# Patient Record
Sex: Female | Born: 1970 | Race: White | Hispanic: No | Smoking: Current every day smoker
Health system: Southern US, Community
[De-identification: ages and names within clinical notes are randomized; demographics above are authoritative.]

## PROBLEM LIST (undated history)

## (undated) DIAGNOSIS — N200 Calculus of kidney: Secondary | ICD-10-CM

## (undated) DIAGNOSIS — G4730 Sleep apnea, unspecified: Secondary | ICD-10-CM

## (undated) DIAGNOSIS — R0602 Shortness of breath: Secondary | ICD-10-CM

## (undated) DIAGNOSIS — K219 Gastro-esophageal reflux disease without esophagitis: Secondary | ICD-10-CM

## (undated) DIAGNOSIS — M549 Dorsalgia, unspecified: Secondary | ICD-10-CM

## (undated) DIAGNOSIS — C801 Malignant (primary) neoplasm, unspecified: Secondary | ICD-10-CM

## (undated) DIAGNOSIS — R61 Generalized hyperhidrosis: Secondary | ICD-10-CM

## (undated) DIAGNOSIS — I2510 Atherosclerotic heart disease of native coronary artery without angina pectoris: Secondary | ICD-10-CM

## (undated) DIAGNOSIS — Z87442 Personal history of urinary calculi: Secondary | ICD-10-CM

## (undated) DIAGNOSIS — I10 Essential (primary) hypertension: Secondary | ICD-10-CM

## (undated) HISTORY — DX: Dorsalgia, unspecified: M54.9

## (undated) HISTORY — DX: Shortness of breath: R06.02

## (undated) HISTORY — DX: Generalized hyperhidrosis: R61

## (undated) HISTORY — PX: BACK SURGERY: SHX140

## (undated) HISTORY — DX: Calculus of kidney: N20.0

---

## 2002-02-19 HISTORY — PX: BACK SURGERY: SHX140

## 2011-07-30 ENCOUNTER — Ambulatory Visit
Admission: RE | Admit: 2011-07-30 | Discharge: 2011-07-30 | Disposition: A | Discharge status: Home / Self Care | Payer: 59 | Source: Ambulatory Visit | Attending: Unknown Physician Specialty | Admitting: Unknown Physician Specialty

## 2011-07-30 ENCOUNTER — Other Ambulatory Visit: Payer: Self-pay | Admitting: Unknown Physician Specialty

## 2011-07-30 DIAGNOSIS — R609 Edema, unspecified: Secondary | ICD-10-CM

## 2011-07-30 DIAGNOSIS — R52 Pain, unspecified: Secondary | ICD-10-CM

## 2011-11-12 ENCOUNTER — Other Ambulatory Visit: Payer: Self-pay | Admitting: Family Medicine

## 2011-11-12 DIAGNOSIS — Z1231 Encounter for screening mammogram for malignant neoplasm of breast: Secondary | ICD-10-CM

## 2012-04-21 ENCOUNTER — Ambulatory Visit (HOSPITAL_COMMUNITY): Payer: 59 | Admitting: Behavioral Health

## 2012-10-13 ENCOUNTER — Ambulatory Visit (HOSPITAL_COMMUNITY): Payer: Self-pay | Admitting: Psychiatry

## 2012-10-15 ENCOUNTER — Ambulatory Visit (INDEPENDENT_AMBULATORY_CARE_PROVIDER_SITE_OTHER): Payer: 59 | Admitting: Psychiatry

## 2012-10-15 ENCOUNTER — Encounter (HOSPITAL_COMMUNITY): Payer: Self-pay | Admitting: Psychiatry

## 2012-10-15 VITALS — BP 123/75 | HR 63 | Ht 62.5 in | Wt 152.0 lb

## 2012-10-15 DIAGNOSIS — G4700 Insomnia, unspecified: Secondary | ICD-10-CM

## 2012-10-15 DIAGNOSIS — F332 Major depressive disorder, recurrent severe without psychotic features: Secondary | ICD-10-CM | POA: Insufficient documentation

## 2012-10-15 DIAGNOSIS — F4310 Post-traumatic stress disorder, unspecified: Secondary | ICD-10-CM

## 2012-10-15 MED ORDER — TRAZODONE HCL 50 MG PO TABS: ORAL_TABLET | ORAL | Status: DC

## 2012-10-15 NOTE — Patient Instructions (Signed)
Not Vapors 1-2 hours before Bedtime. Called tomorrow to report results of trazodone.
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
## 2012-10-16 DIAGNOSIS — F4310 Post-traumatic stress disorder, unspecified: Secondary | ICD-10-CM | POA: Insufficient documentation

## 2012-10-16 DIAGNOSIS — G4700 Insomnia, unspecified: Secondary | ICD-10-CM | POA: Insufficient documentation

## 2012-10-24 ENCOUNTER — Telehealth (HOSPITAL_COMMUNITY): Payer: Self-pay | Admitting: Psychiatry

## 2012-10-24 NOTE — Telephone Encounter (Signed)
Called patient. Left Message that I would call her on Tuesday 10/26/2012.

## 2012-10-29 ENCOUNTER — Ambulatory Visit (INDEPENDENT_AMBULATORY_CARE_PROVIDER_SITE_OTHER): Payer: 59 | Admitting: Psychiatry

## 2012-10-29 ENCOUNTER — Encounter (HOSPITAL_COMMUNITY): Payer: Self-pay | Admitting: Psychiatry

## 2012-10-29 VITALS — BP 105/59 | HR 64 | Ht 62.5 in | Wt 152.0 lb

## 2012-10-29 DIAGNOSIS — F4310 Post-traumatic stress disorder, unspecified: Secondary | ICD-10-CM

## 2012-10-29 DIAGNOSIS — F332 Major depressive disorder, recurrent severe without psychotic features: Secondary | ICD-10-CM

## 2012-10-29 NOTE — Telephone Encounter (Signed)
Called patient. Number is no longer valid.
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
## 2012-10-31 ENCOUNTER — Telehealth (HOSPITAL_COMMUNITY): Payer: Self-pay | Admitting: Psychiatry

## 2012-10-31 NOTE — Telephone Encounter (Signed)
Erroneous encounter. Pleas ignore.

## 2012-11-20 ENCOUNTER — Ambulatory Visit (HOSPITAL_COMMUNITY): Payer: Self-pay | Admitting: Psychiatry

## 2012-12-01 ENCOUNTER — Ambulatory Visit (HOSPITAL_COMMUNITY): Payer: Self-pay | Admitting: Psychiatry

## 2012-12-03 ENCOUNTER — Telehealth (HOSPITAL_COMMUNITY): Payer: Self-pay

## 2012-12-03 NOTE — Telephone Encounter (Signed)
PT NEEDS TO SPEAK TO YOU BEFORE APPT 12/23/12

## 2012-12-04 MED ORDER — MIRTAZAPINE 15 MG PO TBDP: 15.0000 mg | ORAL_TABLET | Freq: Every day | ORAL | Status: DC

## 2012-12-04 NOTE — Telephone Encounter (Signed)
Called patient. She has not been sleeping well despite taking trazodone.  Her mother has been sick and had died in her sleep on 11-29-2022.  She reports she stopped Lexapro about 2 weeks ago.  She report she is not sleeping well Sleeping only 1 hour.  Will try mirtazapine 15 mg. Two tablets at bedtime.

## 2012-12-04 NOTE — Addendum Note (Signed)
Addended by: Larena Sox on: 12/04/2012 05:46 PM   Modules accepted: Orders, Medications

## 2012-12-07 ENCOUNTER — Other Ambulatory Visit (HOSPITAL_COMMUNITY): Payer: Self-pay | Admitting: Psychiatry

## 2012-12-16 ENCOUNTER — Other Ambulatory Visit (HOSPITAL_COMMUNITY): Payer: Self-pay | Admitting: Psychiatry

## 2012-12-23 ENCOUNTER — Ambulatory Visit (HOSPITAL_COMMUNITY): Payer: Self-pay | Admitting: Psychiatry

## 2013-01-01 ENCOUNTER — Telehealth (HOSPITAL_COMMUNITY): Payer: Self-pay

## 2013-01-01 MED ORDER — MIRTAZAPINE 30 MG PO TABS: 30.0000 mg | ORAL_TABLET | Freq: Every day | ORAL | Status: DC

## 2013-01-01 MED ORDER — ESCITALOPRAM OXALATE 20 MG PO TABS: 20.0000 mg | ORAL_TABLET | Freq: Every day | ORAL | Status: DC

## 2013-01-01 MED ORDER — TRAZODONE HCL 100 MG PO TABS: 100.0000 mg | ORAL_TABLET | Freq: Every day | ORAL | Status: DC

## 2013-01-01 NOTE — Telephone Encounter (Addendum)
Called patient.  She has been taking Lexapro 20 mg, and remeron 30 mg.  The patient reports her mood is improved, she is sleeping only 1.5 hours at once.  She states that is not sleeping.   Will reschedule patient for followup.

## 2013-01-01 NOTE — Telephone Encounter (Signed)
PT HAS HAD 3 NO SHOWS WITH YOU AND WOULD LIKE TO SPEAK TO YOU. THE LAST ONE SHE STATES IS BECAUSE SHE HAD A DEATH IN THE FAMILY. PLEASE CALL

## 2013-01-01 NOTE — Addendum Note (Signed)
Addended by: Larena Sox on: 01/01/2013 06:04 PM   Modules accepted: Orders, Medications

## 2013-02-09 ENCOUNTER — Telehealth (HOSPITAL_COMMUNITY): Payer: Self-pay

## 2013-02-11 NOTE — Telephone Encounter (Signed)
Will need to see patient prior to change of medications.

## 2013-03-03 ENCOUNTER — Other Ambulatory Visit (HOSPITAL_COMMUNITY): Payer: Self-pay | Admitting: Psychiatry

## 2013-03-10 ENCOUNTER — Other Ambulatory Visit (HOSPITAL_COMMUNITY): Payer: Self-pay | Admitting: Psychiatry

## 2013-03-10 NOTE — Telephone Encounter (Signed)
Refill request for trazodone provided.

## 2013-04-19 ENCOUNTER — Other Ambulatory Visit (HOSPITAL_COMMUNITY): Payer: Self-pay | Admitting: Psychiatry

## 2013-04-28 ENCOUNTER — Telehealth (HOSPITAL_COMMUNITY): Payer: Self-pay | Admitting: Psychiatry

## 2013-04-28 NOTE — Telephone Encounter (Deleted)
Contacted pharmacy: Spoke with

## 2013-04-28 NOTE — Telephone Encounter (Signed)
Message received on incorrect patient. No change needed

## 2013-04-30 ENCOUNTER — Telehealth (HOSPITAL_COMMUNITY): Payer: Self-pay

## 2013-04-30 NOTE — Telephone Encounter (Signed)
Cannot restart medication since she has been off it for a few weeks atleast. Needs to be seen prior to starting antidepressant

## 2013-04-30 NOTE — Telephone Encounter (Signed)
Can you check on this?

## 2013-04-30 NOTE — Telephone Encounter (Signed)
Kathryn Burgess, We cannot do prescription. She would have to wait for Dr Demetrius CharityP. If there are safety concerns she can go to the nearest ED

## 2013-04-30 NOTE — Telephone Encounter (Signed)
04/30/2013  8:45 AM Kathryn Burgess--Patient Calls:  PT wants a script for Lexapro refilled . Pt has not been seen since Sept. 2014. Please call pt to advise.

## 2013-05-08 NOTE — Telephone Encounter (Signed)
The patient reports she is not taking her medications, and has not done so fo rthe past 2 weeks. She reports a decrease in sex drive. She states suicidal or homicidal ideations. She reports she has not had had any crying spells. She has refills of trazodone left. She has had return of social anxiety. She has been on paxil, wellbutrin, zoloft in the past.

## 2013-05-13 ENCOUNTER — Ambulatory Visit (INDEPENDENT_AMBULATORY_CARE_PROVIDER_SITE_OTHER): Payer: 59 | Admitting: Psychiatry

## 2013-05-13 ENCOUNTER — Encounter (HOSPITAL_COMMUNITY): Payer: Self-pay | Admitting: Psychiatry

## 2013-05-13 VITALS — BP 100/69 | HR 73 | Wt 167.0 lb

## 2013-05-13 DIAGNOSIS — F4310 Post-traumatic stress disorder, unspecified: Secondary | ICD-10-CM

## 2013-05-13 DIAGNOSIS — F332 Major depressive disorder, recurrent severe without psychotic features: Secondary | ICD-10-CM

## 2013-05-13 MED ORDER — TRAZODONE HCL 100 MG PO TABS: ORAL_TABLET | ORAL | Status: AC

## 2013-05-13 MED ORDER — PAROXETINE HCL 10 MG PO TABS: ORAL_TABLET | ORAL | Status: DC
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
## 2013-05-25 ENCOUNTER — Telehealth (HOSPITAL_COMMUNITY): Payer: Self-pay

## 2013-05-26 NOTE — Telephone Encounter (Signed)
Called patient. Left Message stating I would call back.

## 2013-05-27 ENCOUNTER — Telehealth (HOSPITAL_COMMUNITY): Payer: Self-pay

## 2013-05-28 MED ORDER — PAROXETINE HCL 10 MG PO TABS: 10.0000 mg | ORAL_TABLET | Freq: Two times a day (BID) | ORAL | Status: AC

## 2013-05-28 NOTE — Telephone Encounter (Signed)
Patient doing well with Paxil at 10 mg BID.

## 2013-06-12 ENCOUNTER — Ambulatory Visit (HOSPITAL_COMMUNITY): Payer: Self-pay | Admitting: Psychiatry

## 2013-06-28 IMAGING — CR DG FOOT COMPLETE 3+V*L*
3 series · 3 of 3 positions shown · non-contrast
Comparison: None.

CLINICAL DATA: Left foot pain.  No injury.

LEFT FOOT - COMPLETE 3+ VIEW

[view not recorded (1 of 3)]
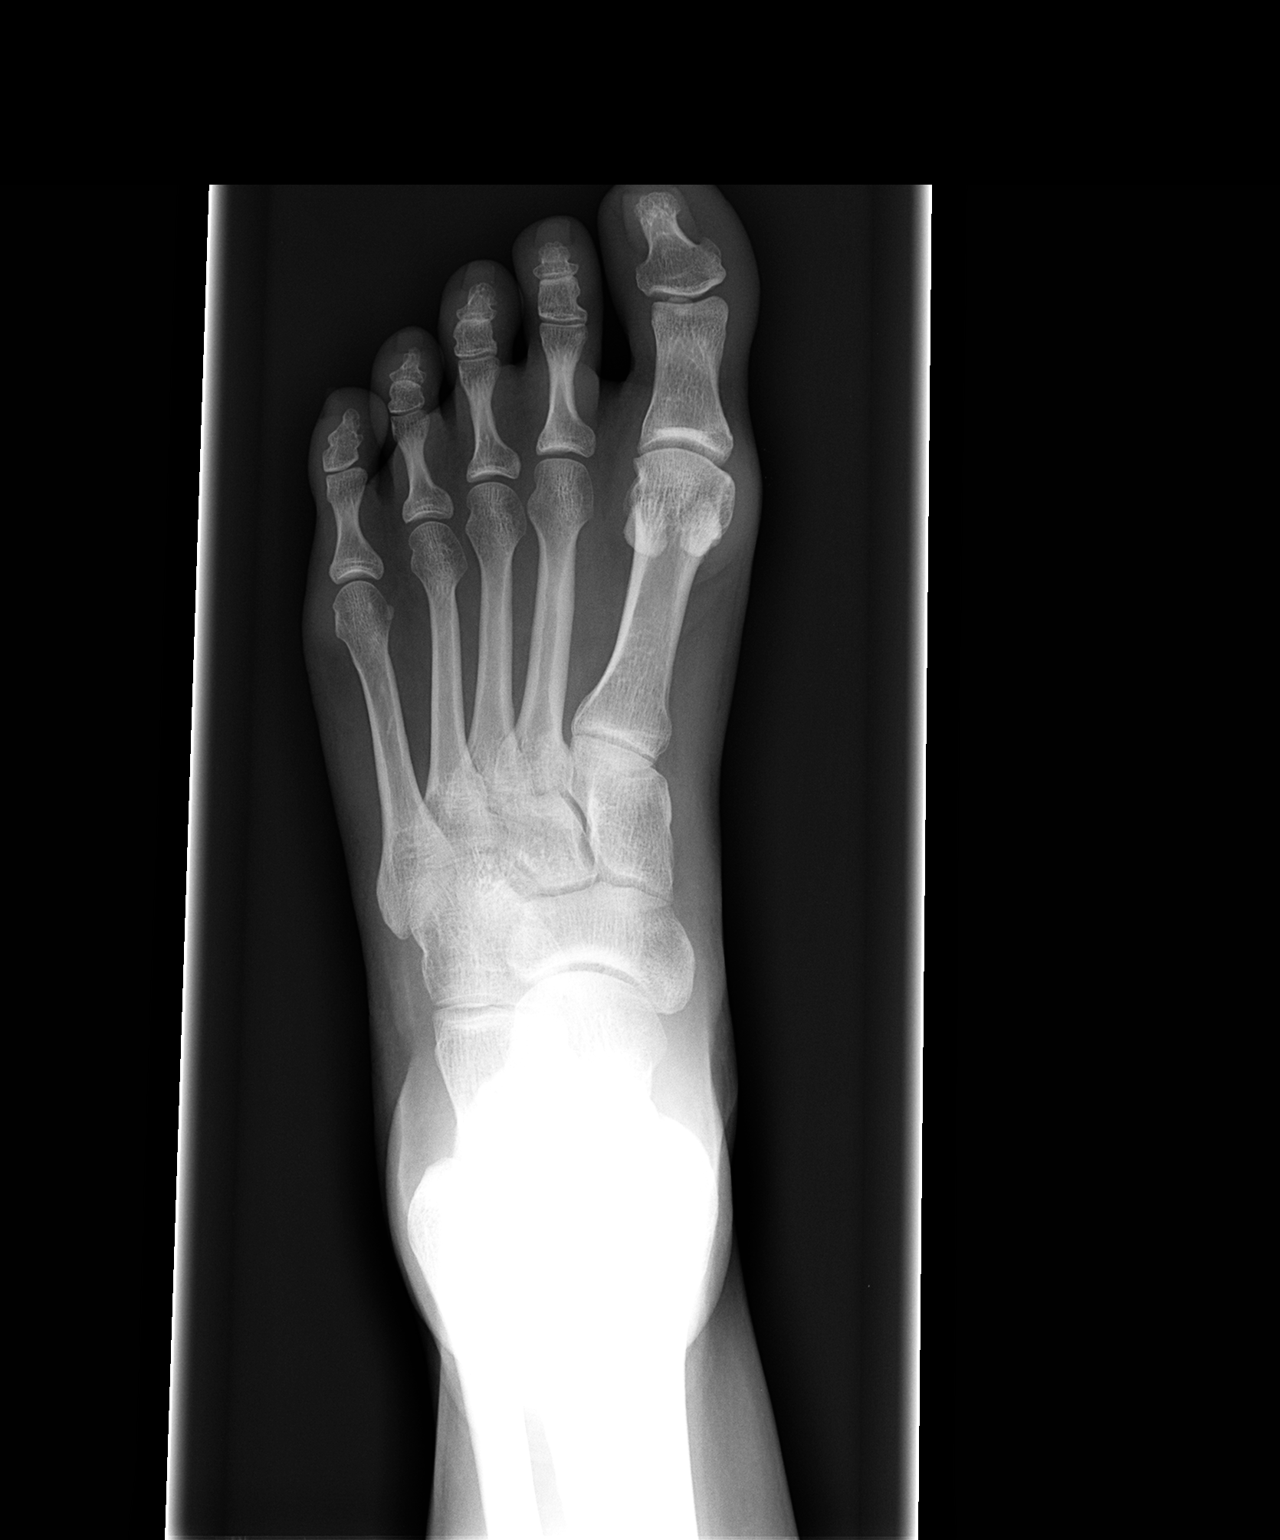

[view not recorded (2 of 3)]
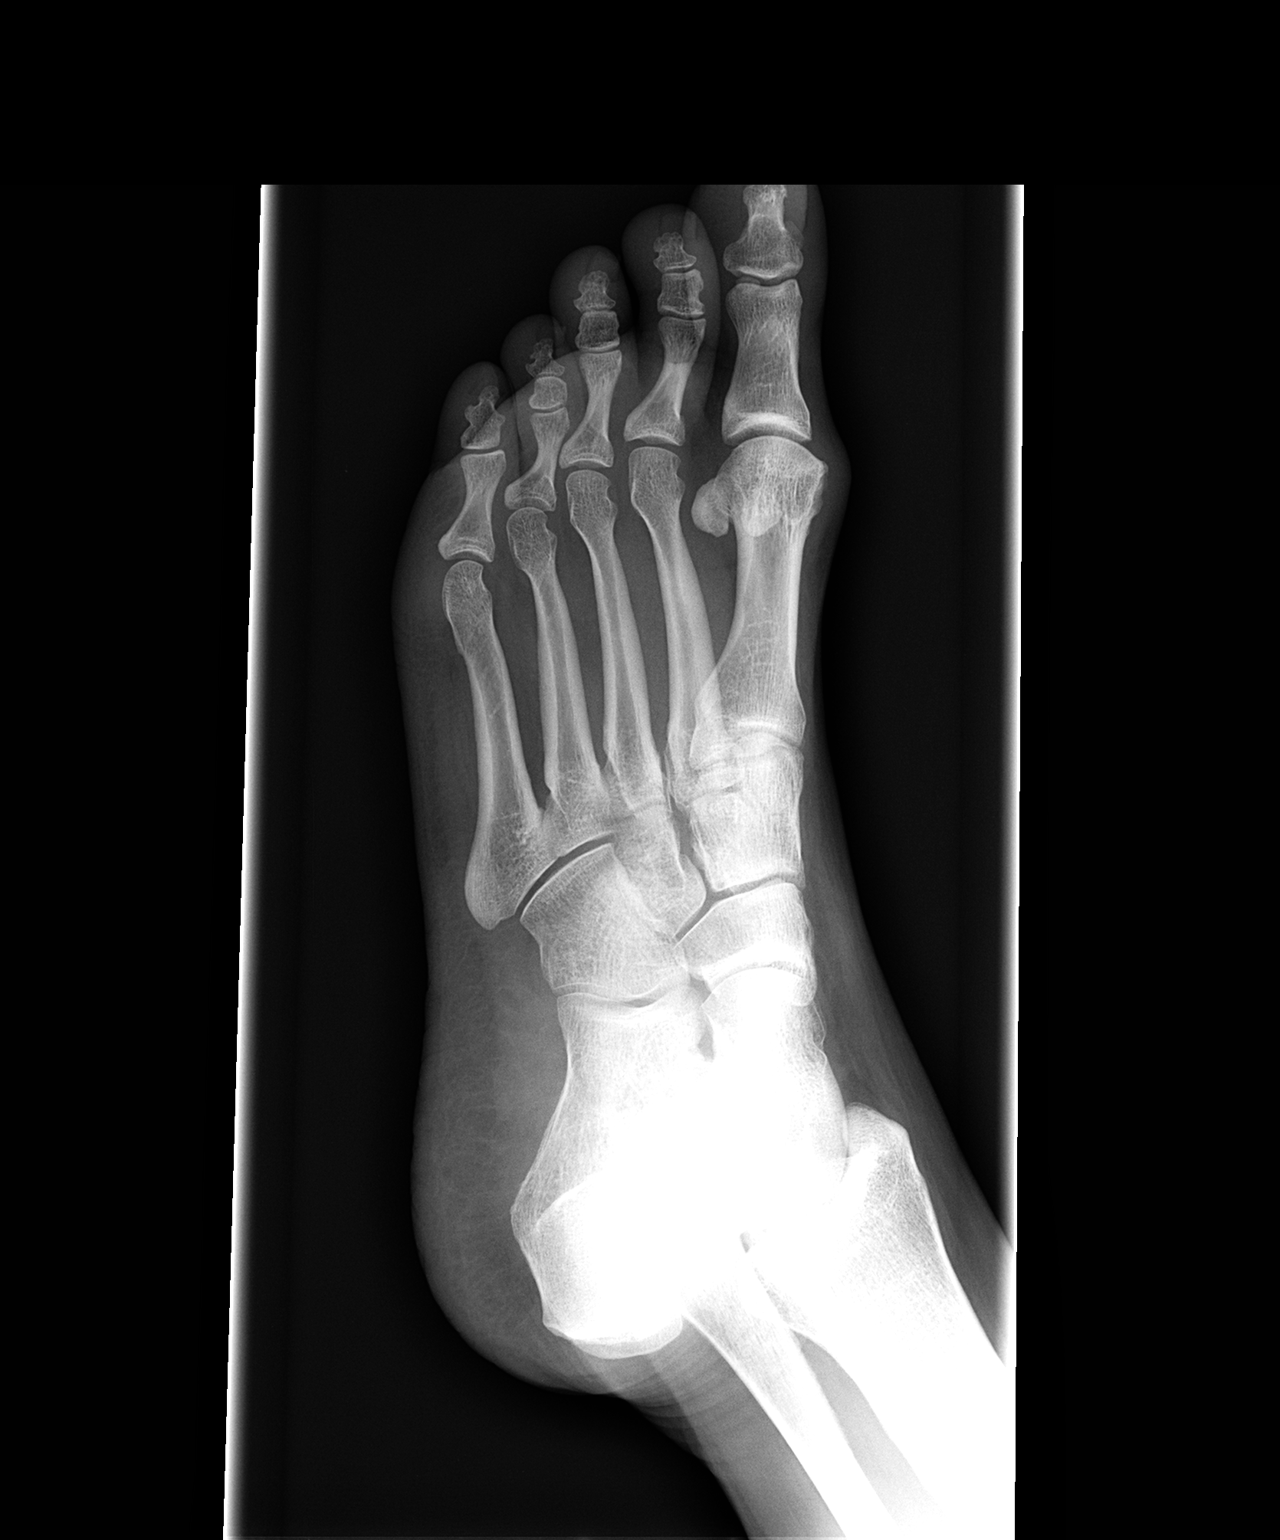

[view not recorded (3 of 3)]
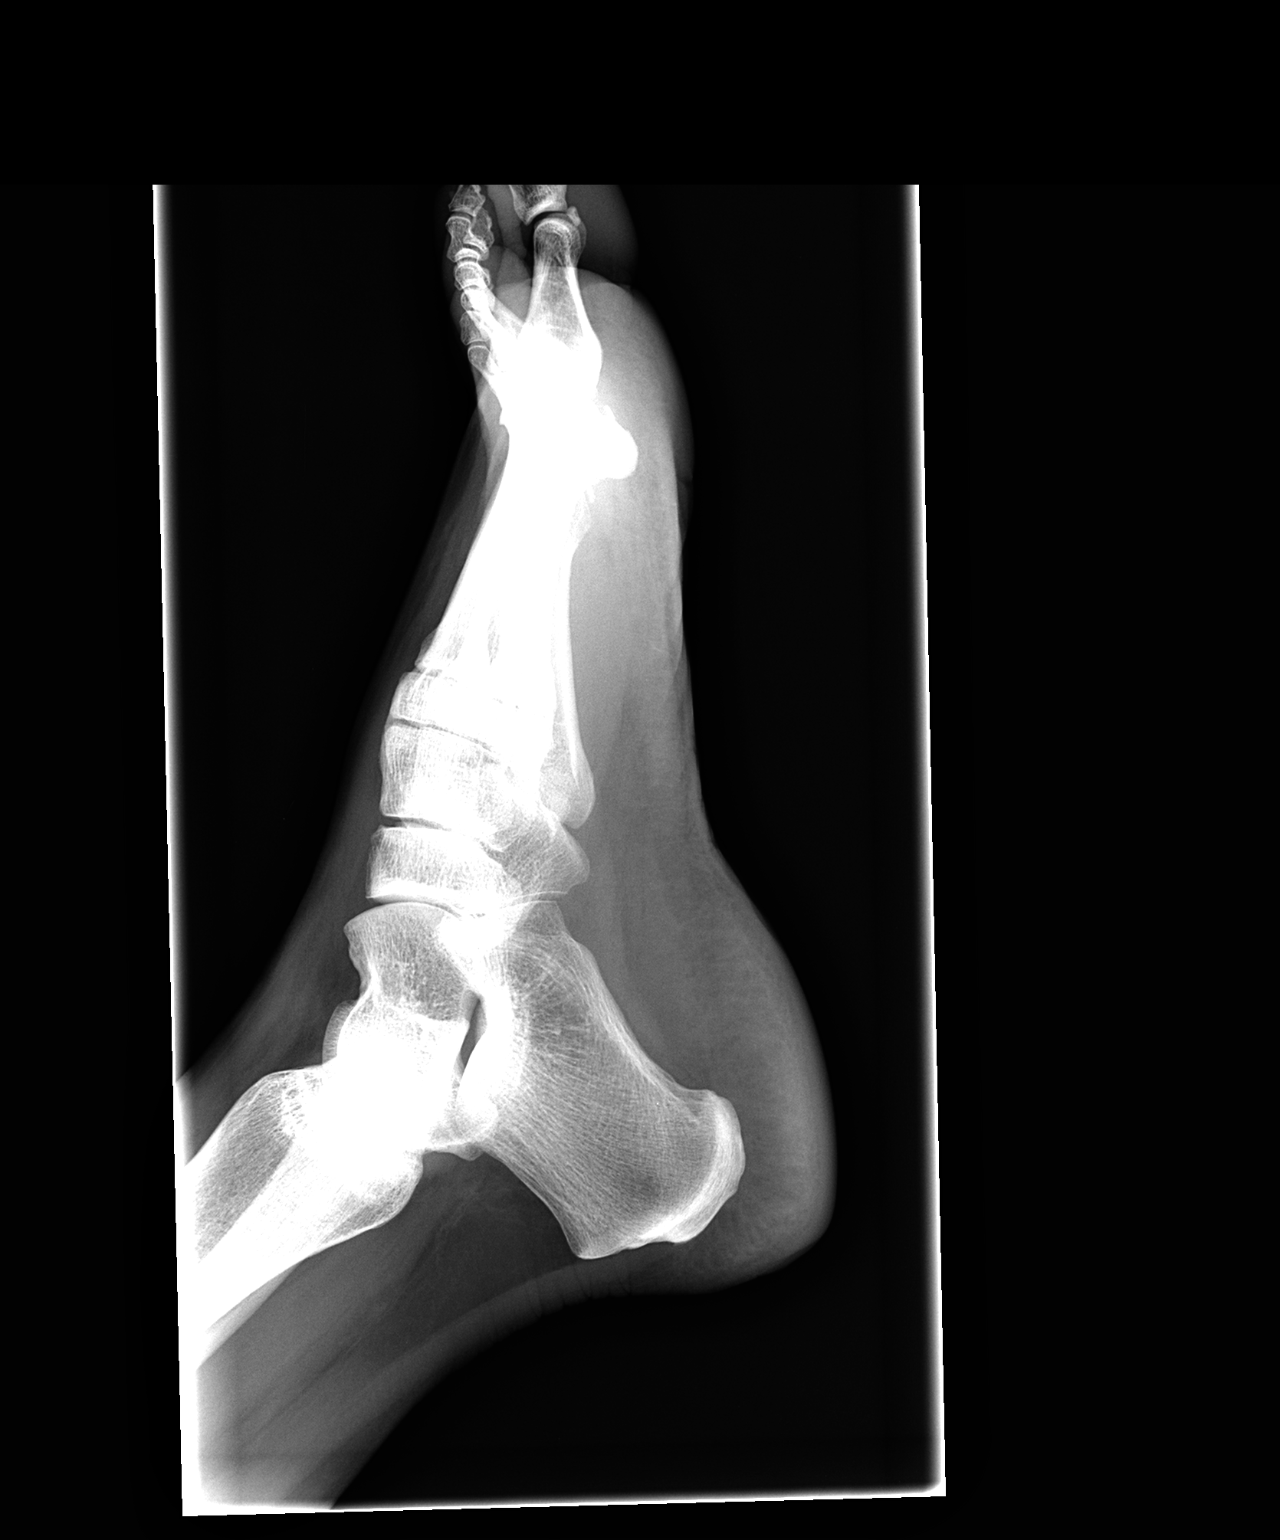

[3 of 3 positions shown; findings below may reference images not displayed]

FINDINGS: Anatomic alignment bones of the left foot.  Mild first
MTP joint osteoarthritis.  There is no fracture identified.  Soft
tissues appear within normal limits.  Fifth metatarsal base is
normal.
IMPRESSION: Mild first MTP joint osteoarthritis.

## 2018-02-19 DIAGNOSIS — I219 Acute myocardial infarction, unspecified: Secondary | ICD-10-CM

## 2018-02-19 HISTORY — DX: Acute myocardial infarction, unspecified: I21.9

## 2022-02-19 HISTORY — PX: HERNIA REPAIR: SHX51

## 2023-05-31 NOTE — Discharge Summary (Signed)
 [facility] Discharge Summary  PCP: Rocky JONELLE Burow, NP Discharge Details   Admit date:         05/29/2023 Discharge date:        06/01/2023  Hospital LOS:    1 days DIscharge Disposition:    Active Hospital Problems   Diagnosis Date Noted POA   *Chest pain 05/29/2023 Yes   Abnormal stress test 05/29/2023 Unknown   History of ST elevation myocardial infarction (STEMI) 06/16/2019 Not Applicable   Tobacco abuse 07/09/2018 Yes    Resolved Hospital Problems   Diagnosis Date Noted Date Resolved POA   Hypertensive urgency 05/29/2023 05/31/2023 Unknown   Unstable angina (*) 05/29/2023 05/31/2023 Unknown    Discharge Medication List as of 06/01/2023 10:19 AM     DISCONTINUED medications     HYDROcodone-acetaminophen (NORCO) 5-325 mg per tablet        CHANGED medications   Details  amLODIPine besylate (NORVASC) 5 mg tablet Take one tablet (5 mg dose) by mouth daily., Starting Sat 06/01/2023, Normal    hydroCHLOROthiazide 25 mg tablet Take one tablet (25 mg dose) by mouth daily., Starting Sat 06/01/2023, Normal    nitroGLYCERIN (NITROSTAT) 0.4 mg SL tablet Place one tablet (0.4 mg dose) under the tongue every 5 (five) minutes as needed for Chest pain., Starting Sat 06/01/2023, Normal       CONTINUED medications   Details  aspirin (ECOTRIN LOW DOSE) EC tablet Take one tablet (81 mg dose) by mouth daily., Starting Tue 07/31/2022, Until Wed 07/31/2023, OTC    atorvastatin (LIPITOR) 40 mg tablet TAKE ONE TABLET BY MOUTH DAILY., Starting Mon 02/18/2023, Normal    ezetimibe (ZETIA) 10 MG tablet Take one tablet (10 mg dose) by mouth daily., Starting Tue 07/31/2022, Normal    gabapentin (NEURONTIN) 300 mg capsule Take one capsule (300 mg dose) by mouth 2 (two) times daily for 30 days., Starting Fri 05/10/2023, Until Sun 06/09/2023, Normal    losartan potassium (COZAAR) 100 mg tablet TAKE ONE TABLET (100 MG DOSE) BY MOUTH DAILY., Starting Mon 01/21/2023, Normal     methocarbamol (ROBAXIN) 500 MG tablet Take one tablet (500 mg dose) by mouth 2 (two) times a day as needed for up to 30 days., Starting Fri 05/10/2023, Until Sun 06/09/2023 at 2359, Normal    pantoprazole sodium (PROTONIX) 40 mg tablet TAKE 1 TABLET BY MOUTH EVERY DAY, Normal    traZODone  (DESYREL ) 100 mg tablet TAKE 2 TABLETS BY MOUTH AT BEDTIME, Starting Fri 05/17/2023, Normal        Reason for Medication Changes:  Hospital Course  Physicians involved in care during this hospitalization Attending Provider: Balinda GORMAN Short, DO Attending Provider: Erla Simper, MD Attending Provider: Ricka JINNY Siren, MD Admitting Provider: Balinda GORMAN Short, DO Consulting Physician: Augusto ONEIDA Crawford Mickey., MD  Indication for Admission: chest pain with history of STEMI  History of Present Illness (from H&P): [name] is a 53 y.o. White or Caucasian [1] female with PMHx of hypertension and CAD (inferior MI in 2020 with PCI to RCA complicated by v fib arrest) who presented from her cardiologist's office for evaluation of chest pain and hypertension. She reports elevated BP over the past several days with readings consistently above 170/100. She noted associated chest discomfort described as twinges between her shoulder blades and lightheadedness. She also reports persistent leg swelling, which she describes as a feeling of tightness. Although not swollen today.  She has not made any changes to her antihypertensive regimen, which  includes losartan 100 mg daily. She has been compliant with this medication and has not missed any doses.    Reviewed cardiology Dr. Chalmers note from earlier today.    In ED, BP elevated at 160/74. Lab evaluation showed initial troponin T of 5 with 1 hr repeat of 5 (delta 1 hr 0) and 3 hr repeat of 6 (delta 3 hour of 1). ECG showed sinus bradycardia. CT chest/abd/pelvis showed the following:    Chest: 1. Negative for thoracic aortic aneurysm or dissection. 2. No  acute pulmonary findings. 3. Evidence of old left ventricle inferior wall myocardial infarction. 4. Nonspecific low-grade mediastinal and hilar adenopathy.   Abdomen/Pelvis: 1. Negative for abdominal aortic aneurysm or dissection. 2. No acute findings. 3. Simple left renal cyst. 4. Fecal retention and colonic diverticulosis.   Hospital Course:      Pt was admitted to med/tele floor and treated for the following:    # Chest pain with hx of STEMI/unstable angina-cPt underwent cardiolyte stress test on 05/30/2023 which showed inducible ischemia within the apical lateral wall. Infarct within the inferior wall.  Echo also showed slightly decreased EF of 45-50%. She was seen by cardiology. She underwent cardiac cath on 05/31/2023 which showed non-obstructive CAD.       # Hypertensive urgency-continue losartan. Started on HCTZ and norvasc per cardiology recommendations. She can keep BP log as outpt and record measurements 3x/day for 1 week. She can take log to next PCP appt.     # Tobacco abuse-pt counseled on cessation and treated with nicotine patch.  On day of discharge, pt was hemodynamically stable. Creatinine on day of discharge was.....   She can f/u with PCP in 1 week and with cardiology in 1-2 weeks.   Bedside Procedures   No orders found     Procedure(s) (LRB): Heart Cath-Left Diagnostic (N/A)  05/31/2023 - 06/01/2023  Surgeon(s): Norleen CHRISTELLA Solar, MD ------------------- [facility]   Discharge Procedure Orders  Follow-up with Primary Care Physician  Referral Priority: Routine Referral Type: Consultation  Referral Reason: Evaluate and Return  Number of Visits Requested: 1 Expiration Date: 11/26/23   Ambulatory referral to Cardiology  Referral Priority: Routine Referral Type: Consultation  Referral Reason: Evaluate and Return  Referred to Provider: ISABELL REDELL LABOR Requested Specialty: Cardiology  Number of Visits Requested: 1 Expiration Date: 11/27/23   Cardiac  diet (heart healthy)   Activity as tolerated   Discharge instructions  Order Comments: -continue amlodipine and hctz -f/u closely with cardiology for further titration of meds -f/u with PCP in 1 week -discontinue tobacco use   Discharge instructions  Order Comments: Please discontinue cardiac telemetry and pulse ox prior to discharge.  Also please remove IV and foley if applicable unless prior order placed to remain upon discharge.   Appointments which have been scheduled    Aug 12, 2023 3:45 PM Office Visit with Deidre Alice, NP [facility] (--) [address] [facility] [address] [place] [address] [phone]   Aug 19, 2023 8:00 AM Annual Physical with Rocky JONELLE Burow, NP [facility] (--) [address] [place] [address] [phone]        Recommendations to physicians:   Followup PCP 1 week  F/u cardiology in 1-2 weeks       Code Status:   Full Code   Time spent in discharge process:  35 minutes  This note was dictated with voice recognition software. Similar  sounding words can inadvertently be transcribed and may not be corrected upon review  Electronically signed: [place] Rita J Mullins-Hodgin, MD 06/01/2023 / 5:16 PM

## 2024-02-27 NOTE — H&P (View-Only) (Signed)
 "   REFERRING PHYSICIAN:  Laurier Lukes  PROVIDER:  LEONOR MACARIO DAWN, MD  MRN: [id number] DOB: 1970/04/03 DATE OF ENCOUNTER: 02/28/2024  Subjective   Chief Complaint: New Consultation (NEW CANCER - carcinoma on calf. exision with SLNB)     History of Present Illness: HPI: Kathryn Burgess is a 54 y.o. Female who was referred for evaluation of a newly-diagnosed melanoma. She first noticed a lesion on her right calf about a year ago, and it more recently noticed changes and scaling over the lesion. She was referred to dermatology. A shave biopsy on 01/23/24 showed a superficial spreading type melanoma with the following characteristics:  Depth: 1.3 mm Ulcerations: no Mitoses: 2 per mm2 Lymphovascular invasion: Absent Pathologic stage: T2a  She was referred to discuss surgery. She has a history of CAD and had a STEMI in 2020. She was admitted at [facility] in April 2025 with chest pain and underwent a LHC, which per report showed nonobstructive CAD. Echo showed a mildly reduced EF of 45-50%. Today she reports she still frequently has chest discomfort with exertion. She is on a baby aspirin but no other blood thinners.   Review of Systems: A complete review of systems was obtained from the patient.  I have reviewed this information and discussed as appropriate with the patient.  See HPI as well for other ROS.    Medical History: Past Medical History:  Diagnosis Date   Anxiety    CHF (congestive heart failure) (CMS/HHS-HCC)    GERD (gastroesophageal reflux disease)    History of cancer    Hyperlipidemia    Hypertension     There is no problem list on file for this patient.   Past Surgical History:  Procedure Laterality Date   CESAREAN SECTION  1997   back surgery  2004   Neck surgery  2014   .Heart cath  05/2018   HERNIA REPAIR       No Known Allergies  Current Outpatient Medications on File Prior to Visit  Medication Sig Dispense Refill   amLODIPine  (NORVASC) 5 MG tablet Take 5 mg by mouth once daily     aspirin 81 MG chewable tablet Take 81 mg by mouth once daily     atorvastatin (LIPITOR) 40 MG tablet Take 40 mg by mouth once daily     buPROPion (WELLBUTRIN XL) 150 MG XL tablet Take 150 mg by mouth once daily     cholecalciferol, vitamin D3, (VITAMIN D3) 125 mcg (5,000 unit) tablet Take 5,000 Units by mouth once daily     ezetimibe (ZETIA) 10 mg tablet Take 10 mg by mouth once daily     famotidine (PEPCID) 20 MG tablet Take 20 mg by mouth once daily     hydroCHLOROthiazide (HYDRODIURIL) 25 MG tablet Take 25 mg by mouth once daily     nitroGLYcerin (NITROSTAT) 0.4 MG SL tablet Place 0.4 mg under the tongue every 5 (five) minutes as needed     sacubitriL-valsartan (ENTRESTO) 49-51 mg tablet Take 1 tablet by mouth 2 (two) times daily     traZODone  (DESYREL ) 100 MG tablet Take 100-200 mg by mouth at bedtime     No current facility-administered medications on file prior to visit.    Family History  Problem Relation Age of Onset   Coronary Artery Disease (Blocked arteries around heart) Mother    Hyperlipidemia (Elevated cholesterol) Mother    High blood pressure (Hypertension) Mother    Stroke Mother    Coronary Artery Disease (Blocked  arteries around heart) Father    Diabetes Father    Diabetes Sister      Social History   Tobacco Use  Smoking Status Every Day   Types: Cigarettes  Smokeless Tobacco Never     Social History   Socioeconomic History   Marital status: Divorced  Tobacco Use   Smoking status: Every Day    Types: Cigarettes   Smokeless tobacco: Never  Substance and Sexual Activity   Alcohol use: Never   Drug use: Never   Social Drivers of Corporate Investment Banker Strain: Low Risk (07/10/2023)   Received from [facility]   Overall Financial Resource Strain (CARDIA)    Difficulty of Paying Living Expenses: Not hard at all  Food Insecurity: No Food Insecurity (07/10/2023)    Received from [facility]   Hunger Vital Sign    Within the past 12 months, you worried that your food would run out before you got the money to buy more.: Never true    Within the past 12 months, the food you bought just didn't last and you didn't have money to get more.: Never true  Transportation Needs: No Transportation Needs (07/10/2023)   Received from [facility] - Transportation    Lack of Transportation (Medical): No    Lack of Transportation (Non-Medical): No  Physical Activity: Unknown (05/29/2023)   Received from [facility]   Exercise Vital Sign    On average, how many days per week do you engage in moderate to strenuous exercise (like a brisk walk)?: 0 days  Stress: No Stress Concern Present (01/11/2023)   Received from [facility] - Occupational Stress Questionnaire    Feeling of Stress : Not at all  Social Connections: Moderately Integrated (05/29/2023)   Received from [facility]   Social Network    How would you rate your social network (family, work, friends)?: Adequate participation with social networks  Housing Stability: Unknown (02/28/2024)   Housing Stability Vital Sign    Homeless in the Last Year: No    Objective:    Vitals:   02/28/24 1514  BP: 105/71  Pulse: 77  Temp: 36.7 C (98 F)  TempSrc: Temporal  SpO2: 95%  Weight: 76.1 kg (167 lb 12.8 oz)  Height: 162.6 cm (5' 4)  PainSc:   4    Body mass index is 28.8 kg/m.  Physical Exam Vitals reviewed.  Constitutional:      General: She is not in acute distress.    Appearance: Normal appearance.  HENT:     Head: Normocephalic and atraumatic.  Eyes:     General: No scleral icterus.    Conjunctiva/sclera: Conjunctivae normal.  Cardiovascular:     Rate and Rhythm: Normal rate and regular rhythm.  Pulmonary:     Effort: Pulmonary effort is normal. No respiratory distress.     Breath sounds: Normal breath sounds.   Lymphadenopathy:     Comments: No palpable inguinal adenopathy bilaterally.  Skin:    Comments: Pigmented lesion on the right posterior lower leg, approximately 1cm in size. No surrounding suspicious lesions.  Neurological:     General: No focal deficit present.     Mental Status: She is alert and oriented to person, place, and time.       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  Wide local excision with sentinel lymph node biopsy was recommended. The details of the procedure were discussed with the patient, including the benefits and the risks of bleeding, infection, wound dehiscence, lymph leak, and seroma. The patient expressed understanding and consents to proceed with surgery. Will request a preoperative cardiac risk stratification from her cardiologist. Her surgery can be performed with MAC if needed to reduce the risk of adverse cardiac events. She does NOT need to hold aspirin for surgery and may continue it throughout the perioperative period. She will be scheduled for an elective surgery date. All questions were answered.  No follow-ups on file.  SHELBY LYNN ALLEN, MD       "

## 2024-02-28 ENCOUNTER — Ambulatory Visit: Payer: Self-pay | Admitting: Surgery

## 2024-02-28 DIAGNOSIS — C4371 Malignant melanoma of right lower limb, including hip: Secondary | ICD-10-CM

## 2024-03-11 NOTE — Progress Notes (Signed)
 Surgical Instructions   Your procedure is scheduled on March 19, 2024. Report to [facility] Main Entrance A at 9:30 A.M., then check in with the Admitting office. Any questions or running late day of surgery: call [phone]  Questions prior to your surgery date: call [phone], Monday-Friday, 8am-4pm. If you experience any cold or flu symptoms such as cough, fever, chills, shortness of breath, etc. between now and your scheduled surgery, please notify us  at the above number.     Remember:  Do not eat after midnight the night before your surgery   You may drink clear liquids until 8:30 the morning of your surgery.   Clear liquids allowed are: Water, Non-Citrus Juices (without pulp), Carbonated Beverages, Clear Tea (no milk, honey, etc.), Black Coffee Only (NO MILK, CREAM OR POWDERED CREAMER of any kind), and Gatorade.    Take these medicines the morning of surgery with A SIP OF WATER  amLODipine (NORVASC)  aspirin  atorvastatin (LIPITOR)  buPROPion (WELLBUTRIN XL)  ezetimibe (ZETIA)  famotidine (PEPCID)   May take these medicines IF NEEDED: nitroGLYCERIN (NITROSTAT) PLEASE CALL [phone] IF USED PRIOR TO SURGERY oxyCODONE-acetaminophen (PERCOCET/ROXICET)   One week prior to surgery, STOP taking any Aspirin (unless otherwise instructed by your surgeon) Aleve, Naproxen, Ibuprofen, Motrin, Advil, Goody's, BC's, all herbal medications, fish oil, and non-prescription vitamins.                     Do NOT Smoke (Tobacco/Vaping) for 24 hours prior to your procedure.  If you use a CPAP at night, you may bring your mask/headgear for your overnight stay.   You will be asked to remove any contacts, glasses, piercing's, hearing aid's, dentures/partials prior to surgery. Please bring cases for these items if needed.    Your surgeon will determine if you are to be admitted or discharged the same day.  Patients discharged the day of surgery will not be allowed to drive home, and  someone needs to stay with them for 24 hours.  SURGICAL WAITING ROOM VISITATION Patients may have no more than 2 support people in the waiting area - these visitors may rotate.   Pre-op nurse will coordinate an appropriate time for 2 ADULT support persons, who may not rotate, to accompany patient in pre-op.  Children under the age of 69 must have an adult with them who is not the patient and must remain in the main waiting area with an adult.  If the patient needs to stay at the hospital during part of their recovery, the visitor guidelines for inpatient rooms apply.  Please refer to the [facility] website for the visitor guidelines for any additional information.   If you received a COVID test during your pre-op visit  it is requested that you wear a mask when out in public, stay away from anyone that may not be feeling well and notify your surgeon if you develop symptoms. If you have been in contact with anyone that has tested positive in the last 10 days please notify you surgeon.      Pre-operative CHG Bathing Instructions   You can play a key role in reducing the risk of infection after surgery. Your skin needs to be as free of germs as possible. You can reduce the number of germs on your skin by washing with CHG (chlorhexidine gluconate) soap before surgery. CHG is an antiseptic soap that kills germs and continues to kill germs even after washing.   DO NOT use if you  have an allergy to chlorhexidine/CHG or antibacterial soaps. If your skin becomes reddened or irritated, stop using the CHG and notify one of our RNs at [phone].              TAKE A SHOWER THE NIGHT BEFORE SURGERY   Please keep in mind the following:  DO NOT shave, including legs and underarms, 48 hours prior to surgery.   You may shave your face before/day of surgery.  Place clean sheets on your bed the night before surgery Use a clean washcloth (not used since being washed) for shower. DO NOT sleep with pet's  night before surgery.  CHG Shower Instructions:  Wash your face and private area with normal soap. If you choose to wash your hair, wash first with your normal shampoo.  After you use shampoo/soap, rinse your hair and body thoroughly to remove shampoo/soap residue.  Turn the water OFF and apply half the bottle of CHG soap to a CLEAN washcloth.  Apply CHG soap ONLY FROM YOUR NECK DOWN TO YOUR TOES (washing for 3-5 minutes)  DO NOT use CHG soap on face, private areas, open wounds, or sores.  Pay special attention to the area where your surgery is being performed.  If you are having back surgery, having someone wash your back for you may be helpful. Wait 2 minutes after CHG soap is applied, then you may rinse off the CHG soap.  Pat dry with a clean towel  Put on clean pajamas    Additional instructions for the day of surgery: If you choose, you may shower the morning of surgery with an antibacterial soap.  DO NOT APPLY any lotions, deodorants, cologne, or perfumes.   Do not wear jewelry or makeup Do not wear nail polish, gel polish, artificial nails, or any other type of covering on natural nails (fingers and toes) Do not bring valuables to the hospital. [facility] is not responsible for valuables/personal belongings. Put on clean/comfortable clothes.  Please brush your teeth.  Ask your nurse before applying any prescription medications to the skin.

## 2024-03-12 ENCOUNTER — Encounter (HOSPITAL_COMMUNITY): Payer: Self-pay

## 2024-03-12 ENCOUNTER — Encounter (HOSPITAL_COMMUNITY)
Admission: RE | Admit: 2024-03-12 | Discharge: 2024-03-12 | Disposition: A | Payer: Self-pay | Source: Ambulatory Visit | Attending: Surgery

## 2024-03-12 ENCOUNTER — Other Ambulatory Visit: Payer: Self-pay

## 2024-03-12 VITALS — BP 118/79 | HR 71 | Temp 97.8°F | Resp 16 | Ht 64.0 in | Wt 170.5 lb

## 2024-03-12 DIAGNOSIS — R079 Chest pain, unspecified: Secondary | ICD-10-CM | POA: Diagnosis present

## 2024-03-12 DIAGNOSIS — Z01818 Encounter for other preprocedural examination: Secondary | ICD-10-CM | POA: Insufficient documentation

## 2024-03-12 DIAGNOSIS — I2510 Atherosclerotic heart disease of native coronary artery without angina pectoris: Secondary | ICD-10-CM | POA: Insufficient documentation

## 2024-03-12 DIAGNOSIS — R9439 Abnormal result of other cardiovascular function study: Secondary | ICD-10-CM | POA: Diagnosis not present

## 2024-03-12 DIAGNOSIS — I253 Aneurysm of heart: Secondary | ICD-10-CM | POA: Insufficient documentation

## 2024-03-12 DIAGNOSIS — I255 Ischemic cardiomyopathy: Secondary | ICD-10-CM | POA: Insufficient documentation

## 2024-03-12 HISTORY — DX: Malignant (primary) neoplasm, unspecified: C80.1

## 2024-03-12 HISTORY — DX: Gastro-esophageal reflux disease without esophagitis: K21.9

## 2024-03-12 HISTORY — DX: Sleep apnea, unspecified: G47.30

## 2024-03-12 HISTORY — DX: Essential (primary) hypertension: I10

## 2024-03-12 HISTORY — DX: Personal history of urinary calculi: Z87.442

## 2024-03-12 HISTORY — DX: Atherosclerotic heart disease of native coronary artery without angina pectoris: I25.10

## 2024-03-12 LAB — BASIC METABOLIC PANEL WITH GFR
Anion gap: 9 (ref 5–15)
BUN: 26 mg/dL — ABNORMAL HIGH (ref 6–20)
CO2: 29 mmol/L (ref 22–32)
Calcium: 9.4 mg/dL (ref 8.9–10.3)
Chloride: 101 mmol/L (ref 98–111)
Creatinine, Ser: 1.16 mg/dL — ABNORMAL HIGH (ref 0.44–1.00)
GFR, Estimated: 55 mL/min — ABNORMAL LOW
Glucose, Bld: 104 mg/dL — ABNORMAL HIGH (ref 70–99)
Potassium: 4.9 mmol/L (ref 3.5–5.1)
Sodium: 139 mmol/L (ref 135–145)

## 2024-03-12 LAB — CBC
HCT: 43.8 % (ref 36.0–46.0)
Hemoglobin: 14.5 g/dL (ref 12.0–15.0)
MCH: 29.1 pg (ref 26.0–34.0)
MCHC: 33.1 g/dL (ref 30.0–36.0)
MCV: 88 fL (ref 80.0–100.0)
Platelets: 269 K/uL (ref 150–400)
RBC: 4.98 MIL/uL (ref 3.87–5.11)
RDW: 13.3 % (ref 11.5–15.5)
WBC: 7.8 K/uL (ref 4.0–10.5)
nRBC: 0 % (ref 0.0–0.2)

## 2024-03-12 NOTE — Progress Notes (Addendum)
 PCP - Dr. Camellia Car Cardiologist - Dr. Redell Montenegro (LOV 11/07/23, pt states she received cardiac clearance and this was given to Dr. Verla office. 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  Last dose of GLP1 agonist-  denies  Blood Thinner Instructions: denies Aspirin Instructions: told to follow surgeon's instructions, pt stated she was told to continue aspirin until surgery  ERAS Protcol - Clear liquids until 0830  COVID TEST- NA   Anesthesia review: yes, cardiac clearance, records request f/u. Pt reported intermittent burning chest pain that she attributes to GERD. Does not worsen with activity but is worse if she misses her reflux meds. Lynwood Hope, PA-C notified during PAT appt.   Patient denies shortness of breath, fever, cough and chest pain at PAT appointment. Denies any respiratory symptoms the last two months other than a chronic smoker's cough.   All instructions explained to the patient, with a verbal understanding of the material. Patient agrees to go over the instructions while at home for a better understanding. The opportunity to ask questions was provided.

## 2024-03-12 NOTE — Pre-Procedure Instructions (Signed)
 Surgical Instructions   Your procedure is scheduled on March 19, 2024. Report to [facility] Main Entrance A at 9:30 A.M., then check in with the Admitting office. Any questions or running late day of surgery: call [phone]  Questions prior to your surgery date: call [phone], Monday-Friday, 8am-4pm. If you experience any cold or flu symptoms such as cough, fever, chills, shortness of breath, etc. between now and your scheduled surgery, please notify us  at the above number.     Remember:  Do not eat after midnight the night before your surgery   You may drink clear liquids until 8:30 the morning of your surgery.   Clear liquids allowed are: Water, Non-Citrus Juices (without pulp), Carbonated Beverages, Clear Tea (no milk, honey, etc.), Black Coffee Only (NO MILK, CREAM OR POWDERED CREAMER of any kind), and Gatorade.    Take these medicines the morning of surgery with A SIP OF WATER  amLODipine (NORVASC)  atorvastatin (LIPITOR)  buPROPion (WELLBUTRIN XL)  ezetimibe (ZETIA)  famotidine (PEPCID)   May take these medicines IF NEEDED: nitroGLYCERIN (NITROSTAT) PLEASE CALL [phone] IF USED PRIOR TO SURGERY oxyCODONE-acetaminophen (PERCOCET/ROXICET)   One week prior to surgery, STOP taking any  Aleve, Naproxen, Ibuprofen, Motrin, Advil, Goody's, BC's, all herbal medications, fish oil, and non-prescription vitamins.  Follow your surgeon's instructions on taking/stopping aspirin prior to surgery.                      Do NOT Smoke (Tobacco/Vaping) for 24 hours prior to your procedure.  If you use a CPAP at night, you may bring your mask/headgear for your overnight stay.   You will be asked to remove any contacts, glasses, piercing's, hearing aid's, dentures/partials prior to surgery. Please bring cases for these items if needed.    Your surgeon will determine if you are to be admitted or discharged the same day.  Patients discharged the day of surgery will not be allowed  to drive home, and someone needs to stay with them for 24 hours.  SURGICAL WAITING ROOM VISITATION Patients may have no more than 2 support people in the waiting area - these visitors may rotate.   Pre-op nurse will coordinate an appropriate time for 2 ADULT support persons, who may not rotate, to accompany patient in pre-op.  Children under the age of 34 must have an adult with them who is not the patient and must remain in the main waiting area with an adult.  If the patient needs to stay at the hospital during part of their recovery, the visitor guidelines for inpatient rooms apply.  Please refer to the [facility] website for the visitor guidelines for any additional information.   If you received a COVID test during your pre-op visit  it is requested that you wear a mask when out in public, stay away from anyone that may not be feeling well and notify your surgeon if you develop symptoms. If you have been in contact with anyone that has tested positive in the last 10 days please notify you surgeon.      Pre-operative CHG Bathing Instructions   You can play a key role in reducing the risk of infection after surgery. Your skin needs to be as free of germs as possible. You can reduce the number of germs on your skin by washing with CHG (chlorhexidine gluconate) soap before surgery. CHG is an antiseptic soap that kills germs and continues to kill germs even after washing.   DO  NOT use if you have an allergy to chlorhexidine/CHG or antibacterial soaps. If your skin becomes reddened or irritated, stop using the CHG and notify one of our RNs at [phone].              TAKE A SHOWER THE NIGHT BEFORE SURGERY   Please keep in mind the following:  DO NOT shave, including legs and underarms, 48 hours prior to surgery.   You may shave your face before/day of surgery.  Place clean sheets on your bed the night before surgery Use a clean washcloth (not used since being washed) for shower. DO  NOT sleep with pet's night before surgery.  CHG Shower Instructions:  Wash your face and private area with normal soap. If you choose to wash your hair, wash first with your normal shampoo.  After you use shampoo/soap, rinse your hair and body thoroughly to remove shampoo/soap residue.  Turn the water OFF and apply half the bottle of CHG soap to a CLEAN washcloth.  Apply CHG soap ONLY FROM YOUR NECK DOWN TO YOUR TOES (washing for 3-5 minutes)  DO NOT use CHG soap on face, private areas, open wounds, or sores.  Pay special attention to the area where your surgery is being performed.  If you are having back surgery, having someone wash your back for you may be helpful. Wait 2 minutes after CHG soap is applied, then you may rinse off the CHG soap.  Pat dry with a clean towel  Put on clean pajamas    Additional instructions for the day of surgery: If you choose, you may shower the morning of surgery with an antibacterial soap.  DO NOT APPLY any lotions, deodorants, cologne, or perfumes.   Do not wear jewelry or makeup Do not wear nail polish, gel polish, artificial nails, or any other type of covering on natural nails (fingers and toes) Do not bring valuables to the hospital. [facility] is not responsible for valuables/personal belongings. Put on clean/comfortable clothes.  Please brush your teeth.  Ask your nurse before applying any prescription medications to the skin.

## 2024-03-13 NOTE — Anesthesia Preprocedure Evaluation (Addendum)
 "                                  Anesthesia Evaluation  Patient identified by MRN, date of birth, ID band Patient awake    Reviewed: Allergy & Precautions, NPO status , Patient's Chart, lab work & pertinent test results  History of Anesthesia Complications Negative for: history of anesthetic complications  Airway Mallampati: III  TM Distance: >3 FB Neck ROM: Full    Dental  (+) Dental Advisory Given   Pulmonary neg shortness of breath, sleep apnea , neg COPD, neg recent URI, Current Smoker (smokes 1 ppd, smoke 1 cigarette this morning)Patient did not abstain from smoking.   Pulmonary exam normal breath sounds clear to auscultation       Cardiovascular hypertension, + angina (LHC 05/2023 with non-obstructive CAD)  + CAD (non-obstructive) and + Past MI (2020)  (-) Cardiac Stents and (-) CABG (-) dysrhythmias  Rhythm:Regular Rate:Normal  Cath 05/31/2023 (Care Everywhere): CONCLUSIONS:  Chest pain syndrome with normal serial high-sensitivity troponin,  nonischemic EKG  Abnormal nuclear stress test with inferior infarct with interpretation of  apical lateral ischemia.  Nonobstructive coronary artery disease with no focal angiographic stenosis  or obstructive stenosis.  Recanalized posterior descending artery.  Ischemic cardiomyopathy with mild to moderately reduced left-ventricular  ejection fraction with inferoapical ventricular aneurysm   RECOMMENDATIONS:  Guideline directed therapies and risk factor modification    TTE 05/30/2023 (Care Everywhere): Left Ventricle: Systolic function is mildly abnormal. EF: 45-50%.  Quantitative analysis of left ventricular Global Longitudinal Strain (GLS)  imaging is -11.500%, which is abnormal. The strain pattern is nonspecific.    Left Ventricle: Doppler parameters consistent with mild diastolic  dysfunction and low to normal LA pressure.     Neuro/Psych neg Seizures PSYCHIATRIC DISORDERS (PTSD) Anxiety Depression        GI/Hepatic Neg liver ROS,GERD  ,,  Endo/Other  negative endocrine ROS    Renal/GU negative Renal ROS     Musculoskeletal   Abdominal   Peds  Hematology negative hematology ROS (+) Lab Results      Component                Value               Date                      WBC                      7.8                 03/12/2024                HGB                      14.5                03/12/2024                HCT                      43.8                03/12/2024                MCV  88.0                03/12/2024                PLT                      269                 03/12/2024              Anesthesia Other Findings Melanoma   Reproductive/Obstetrics                              Anesthesia Physical Anesthesia Plan  ASA: 3  Anesthesia Plan: General   Post-op Pain Management: Tylenol  PO (pre-op)*   Induction: Intravenous  PONV Risk Score and Plan: 2 and Ondansetron , Dexamethasone , Midazolam  and Treatment may vary due to age or medical condition  Airway Management Planned: Oral ETT  Additional Equipment:   Intra-op Plan:   Post-operative Plan: Extubation in OR  Informed Consent: I have reviewed the patients History and Physical, chart, labs and discussed the procedure including the risks, benefits and alternatives for the proposed anesthesia with the patient or authorized representative who has indicated his/her understanding and acceptance.     Dental advisory given  Plan Discussed with: CRNA and Anesthesiologist  Anesthesia Plan Comments: (Risks of general anesthesia discussed including, but not limited to, sore throat, hoarse voice, chipped/damaged teeth, injury to vocal cords, nausea and vomiting, allergic reactions, lung infection, heart attack, stroke, and death. All questions answered.   PAT note by Lynwood Hope, PA-C: 54 year old female follows with cardiology at [facility] for history of HTN, ICM, HFmrEF, CAD  s/p inferior MI in 2020 with PCI to RCA c/b V-fib arrest (distal PDA clot, no stenting, received intracoronary Aggrastat; went into V-fib after Aggrastat administration and required 1 defibrillation and converted to sinus rhythm).  Echo 05/2023 with LVEF 45 to 50%, no significant valvular abnormalities.  Abnormal stress test 05/2023 was followed by catheterization showing nonobstructive CAD with no focal angiographic stenosis or obstructive stenosis, ischemic cardiomyopathy with mild to moderately reduced left ventricular ejection fraction with inferoapical ventricular aneurysm.  Patient last seen in follow-up by Dr. Isabell on 11/07/2023, stable from cardiac standpoint.  Clearance dated 03/02/2024 states, Cardiovascular risk is low for stated procedure.  Copy scanned in media.  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  Preop labs reviewed, creatinine mildly elevated 1.16, otherwise unremarkable.  EKG 05/29/2023 (copy scanned in media): Sinus bradycardia.  Rate 55.  Possible inferior infarct (cited on or before 06/15/2019).  Anterior infarct (cited on or before 05/06/2015).  No significant change.  Cath 05/31/2023 (Care Everywhere): CONCLUSIONS:  Chest pain syndrome with normal serial high-sensitivity troponin,  nonischemic EKG  Abnormal nuclear stress test with inferior infarct with interpretation of  apical lateral ischemia.  Nonobstructive coronary artery disease with no focal angiographic stenosis  or obstructive stenosis. Recanalized posterior descending artery.  Ischemic cardiomyopathy with mild to moderately reduced left-ventricular  ejection fraction with inferoapical ventricular aneurysm   RECOMMENDATIONS:  Guideline directed therapies and risk factor modification   TTE 05/30/2023 (Care Everywhere): LeftVentricle: Systolic function is mildly abnormal. EF:  45-50%.  Quantitative analysis of left ventricular Global Longitudinal Strain (GLS)  imaging is -11.500%, which is abnormal. The strain pattern is nonspecific.  LeftVentricle: Doppler parameters consistent with mild diastolic  dysfunction and low to normal LA pressure.    )         Anesthesia Quick Evaluation  "

## 2024-03-13 NOTE — Progress Notes (Signed)
 Anesthesia Chart Review:  54 year old female follows with cardiology at [facility] for history of HTN, ICM, HFmrEF, CAD s/p inferior MI in 2020 with PCI to RCA c/b V-fib arrest (distal PDA clot, no stenting, received intracoronary Aggrastat; went into V-fib after Aggrastat administration and required 1 defibrillation and converted to sinus rhythm).  Echo 05/2023 with LVEF 45 to 50%, no significant valvular abnormalities.  Abnormal stress test 05/2023 was followed by catheterization showing nonobstructive CAD with no focal angiographic stenosis or obstructive stenosis, ischemic cardiomyopathy with mild to moderately reduced left ventricular ejection fraction with inferoapical ventricular aneurysm.  Patient last seen in follow-up by Dr. Isabell on 11/07/2023, stable from cardiac standpoint.  Clearance dated 03/02/2024 states, Cardiovascular risk is low for stated procedure.  Copy scanned in media.  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  Preop labs reviewed, creatinine mildly elevated 1.16, otherwise unremarkable.  EKG 05/29/2023 (copy scanned in media): Sinus bradycardia.  Rate 55.  Possible inferior infarct (cited on or before 06/15/2019).  Anterior infarct (cited on or before 05/06/2015).  No significant change.  Cath 05/31/2023 (Care Everywhere): CONCLUSIONS:  Chest pain syndrome with normal serial high-sensitivity troponin,  nonischemic EKG  Abnormal nuclear stress test with inferior infarct with interpretation of  apical lateral ischemia.  Nonobstructive coronary artery disease with no focal angiographic stenosis  or obstructive stenosis.  Recanalized posterior descending artery.  Ischemic cardiomyopathy with mild to moderately reduced left-ventricular  ejection fraction with inferoapical ventricular aneurysm   RECOMMENDATIONS:  Guideline directed therapies and risk  factor modification   TTE 05/30/2023 (Care Everywhere): Left Ventricle: Systolic function is mildly abnormal. EF: 45-50%.  Quantitative analysis of left ventricular Global Longitudinal Strain (GLS)  imaging is -11.500%, which is abnormal. The strain pattern is nonspecific.    Left Ventricle: Doppler parameters consistent with mild diastolic  dysfunction and low to normal LA pressure.     Lynwood Geofm RIGGERS [facility]/Anesthesiology Phone [phone] 03/13/2024 2:43 PM

## 2024-03-19 ENCOUNTER — Encounter (HOSPITAL_COMMUNITY): Payer: Self-pay | Admitting: Physician Assistant

## 2024-03-19 ENCOUNTER — Other Ambulatory Visit: Payer: Self-pay

## 2024-03-19 ENCOUNTER — Encounter (HOSPITAL_COMMUNITY): Payer: Self-pay | Admitting: Surgery

## 2024-03-19 ENCOUNTER — Ambulatory Visit (HOSPITAL_COMMUNITY)
Admission: RE | Admit: 2024-03-19 | Discharge: 2024-03-20 | Disposition: A | Discharge status: Home / Self Care | Payer: Self-pay | Attending: Surgery | Admitting: Surgery

## 2024-03-19 ENCOUNTER — Ambulatory Visit (HOSPITAL_COMMUNITY)
Admission: RE | Admit: 2024-03-19 | Discharge: 2024-03-19 | Disposition: A | Discharge status: Home / Self Care | Source: Ambulatory Visit | Attending: Surgery | Admitting: Surgery

## 2024-03-19 ENCOUNTER — Encounter (HOSPITAL_COMMUNITY)
Admission: RE | Disposition: A | Discharge status: Home / Self Care | Payer: Self-pay | Source: Home / Self Care | Attending: Surgery

## 2024-03-19 ENCOUNTER — Encounter (HOSPITAL_COMMUNITY): Payer: Self-pay | Admitting: Anesthesiology

## 2024-03-19 DIAGNOSIS — I2510 Atherosclerotic heart disease of native coronary artery without angina pectoris: Secondary | ICD-10-CM | POA: Diagnosis not present

## 2024-03-19 DIAGNOSIS — I10 Essential (primary) hypertension: Secondary | ICD-10-CM

## 2024-03-19 DIAGNOSIS — C4371 Malignant melanoma of right lower limb, including hip: Secondary | ICD-10-CM | POA: Diagnosis not present

## 2024-03-19 DIAGNOSIS — C439 Malignant melanoma of skin, unspecified: Secondary | ICD-10-CM | POA: Diagnosis present

## 2024-03-19 DIAGNOSIS — F17210 Nicotine dependence, cigarettes, uncomplicated: Secondary | ICD-10-CM

## 2024-03-19 MED ORDER — HYDROMORPHONE HCL 1 MG/ML IJ SOLN
0.5000 mg | INTRAMUSCULAR | Status: DC | PRN
  Administered 2024-03-20: 0.5 mg via INTRAVENOUS
  Filled 2024-03-19: qty 0.5

## 2024-03-19 MED ORDER — ONDANSETRON 4 MG PO TBDP: 4.0000 mg | ORAL_TABLET | Freq: Four times a day (QID) | ORAL | Status: DC | PRN

## 2024-03-19 MED ORDER — LACTATED RINGERS IV SOLN: INTRAVENOUS | Status: DC

## 2024-03-19 MED ORDER — ISOSULFAN BLUE 1 % ~~LOC~~ SOLN
5.0000 mg | Freq: Once | SUBCUTANEOUS | Status: DC
  Administered 2024-03-19: 5 mg via SUBCUTANEOUS
  Filled 2024-03-19: qty 5

## 2024-03-19 MED ORDER — 0.9 % SODIUM CHLORIDE (POUR BTL) OPTIME
TOPICAL | Status: DC | PRN
  Administered 2024-03-19: 1000 mL

## 2024-03-19 MED ORDER — PHENYLEPHRINE 80 MCG/ML (10ML) SYRINGE FOR IV PUSH (FOR BLOOD PRESSURE SUPPORT)
PREFILLED_SYRINGE | INTRAVENOUS | Status: AC
  Filled 2024-03-19: qty 10

## 2024-03-19 MED ORDER — ENOXAPARIN SODIUM 40 MG/0.4ML IJ SOSY: 40.0000 mg | PREFILLED_SYRINGE | INTRAMUSCULAR | Status: DC

## 2024-03-19 MED ORDER — MIDAZOLAM HCL (PF) 2 MG/2ML IJ SOLN
INTRAMUSCULAR | Status: DC | PRN
  Administered 2024-03-19: 2 mg via INTRAVENOUS

## 2024-03-19 MED ORDER — PHENYLEPHRINE HCL-NACL 20-0.9 MG/250ML-% IV SOLN
INTRAVENOUS | Status: DC | PRN
  Administered 2024-03-19: 80 ug via INTRAVENOUS

## 2024-03-19 MED ORDER — ONDANSETRON HCL 4 MG/2ML IJ SOLN
INTRAMUSCULAR | Status: DC | PRN
  Administered 2024-03-19: 4 mg via INTRAVENOUS

## 2024-03-19 MED ORDER — MIDAZOLAM HCL 2 MG/2ML IJ SOLN
INTRAMUSCULAR | Status: AC
  Filled 2024-03-19: qty 2

## 2024-03-19 MED ORDER — DIPHENHYDRAMINE HCL 50 MG/ML IJ SOLN: 25.0000 mg | Freq: Four times a day (QID) | INTRAMUSCULAR | Status: DC | PRN

## 2024-03-19 MED ORDER — CHLORHEXIDINE GLUCONATE 0.12 % MT SOLN
15.0000 mL | Freq: Once | OROMUCOSAL | Status: AC
  Administered 2024-03-19: 15 mL via OROMUCOSAL
  Filled 2024-03-19: qty 15

## 2024-03-19 MED ORDER — ONDANSETRON HCL 4 MG/2ML IJ SOLN
INTRAMUSCULAR | Status: AC
  Filled 2024-03-19: qty 2

## 2024-03-19 MED ORDER — ROCURONIUM BROMIDE 10 MG/ML (PF) SYRINGE
PREFILLED_SYRINGE | INTRAVENOUS | Status: AC
  Filled 2024-03-19: qty 10

## 2024-03-19 MED ORDER — PROPOFOL 10 MG/ML IV BOLUS
INTRAVENOUS | Status: DC | PRN
  Administered 2024-03-19: 200 mg via INTRAVENOUS

## 2024-03-19 MED ORDER — FENTANYL CITRATE (PF) 100 MCG/2ML IJ SOLN
INTRAMUSCULAR | Status: AC
  Filled 2024-03-19: qty 2

## 2024-03-19 MED ORDER — EPHEDRINE SULFATE-NACL 50-0.9 MG/10ML-% IV SOSY
PREFILLED_SYRINGE | INTRAVENOUS | Status: DC | PRN
  Administered 2024-03-19 (×2): 5 mg via INTRAVENOUS

## 2024-03-19 MED ORDER — FENTANYL CITRATE (PF) 100 MCG/2ML IJ SOLN
25.0000 ug | INTRAMUSCULAR | Status: DC | PRN
  Administered 2024-03-19 (×2): 50 ug via INTRAVENOUS

## 2024-03-19 MED ORDER — DIPHENHYDRAMINE HCL 25 MG PO CAPS
25.0000 mg | ORAL_CAPSULE | Freq: Four times a day (QID) | ORAL | Status: DC | PRN
  Administered 2024-03-19: 25 mg via ORAL
  Filled 2024-03-19: qty 1

## 2024-03-19 MED ORDER — MELATONIN 3 MG PO TABS
3.0000 mg | ORAL_TABLET | Freq: Every evening | ORAL | Status: DC | PRN
  Administered 2024-03-20: 3 mg via ORAL
  Filled 2024-03-19: qty 1

## 2024-03-19 MED ORDER — DEXAMETHASONE SOD PHOSPHATE PF 10 MG/ML IJ SOLN
INTRAMUSCULAR | Status: DC | PRN
  Administered 2024-03-19: 10 mg via INTRAVENOUS

## 2024-03-19 MED ORDER — LIDOCAINE 2% (20 MG/ML) 5 ML SYRINGE
INTRAMUSCULAR | Status: DC | PRN
  Administered 2024-03-19: 100 mg via INTRAVENOUS

## 2024-03-19 MED ORDER — ACETAMINOPHEN 500 MG PO TABS
1000.0000 mg | ORAL_TABLET | Freq: Three times a day (TID) | ORAL | Status: DC
  Administered 2024-03-19 (×2): 1000 mg via ORAL
  Filled 2024-03-19 (×2): qty 2

## 2024-03-19 MED ORDER — BUPIVACAINE-EPINEPHRINE (PF) 0.25% -1:200000 IJ SOLN
INTRAMUSCULAR | Status: AC
  Filled 2024-03-19: qty 30

## 2024-03-19 MED ORDER — DOCUSATE SODIUM 100 MG PO CAPS
100.0000 mg | ORAL_CAPSULE | Freq: Two times a day (BID) | ORAL | Status: DC
  Administered 2024-03-19: 100 mg via ORAL
  Filled 2024-03-19: qty 1

## 2024-03-19 MED ORDER — PROPOFOL 10 MG/ML IV BOLUS
INTRAVENOUS | Status: AC
  Filled 2024-03-19: qty 20

## 2024-03-19 MED ORDER — ORAL CARE MOUTH RINSE: 15.0000 mL | Freq: Once | OROMUCOSAL | Status: AC

## 2024-03-19 MED ORDER — ISOSULFAN BLUE 1 % ~~LOC~~ SOLN
SUBCUTANEOUS | Status: DC | PRN
  Administered 2024-03-19: 2 mL via SUBCUTANEOUS

## 2024-03-19 MED ORDER — OXYCODONE HCL 5 MG/5ML PO SOLN: 5.0000 mg | Freq: Once | ORAL | Status: DC | PRN

## 2024-03-19 MED ORDER — AMISULPRIDE (ANTIEMETIC) 5 MG/2ML IV SOLN: 10.0000 mg | Freq: Once | INTRAVENOUS | Status: DC | PRN

## 2024-03-19 MED ORDER — ONDANSETRON HCL 4 MG/2ML IJ SOLN: 4.0000 mg | Freq: Four times a day (QID) | INTRAMUSCULAR | Status: DC | PRN

## 2024-03-19 MED ORDER — METHOCARBAMOL 500 MG PO TABS
500.0000 mg | ORAL_TABLET | Freq: Four times a day (QID) | ORAL | Status: DC
  Administered 2024-03-19 (×2): 500 mg via ORAL
  Filled 2024-03-19 (×2): qty 1

## 2024-03-19 MED ORDER — FENTANYL CITRATE (PF) 250 MCG/5ML IJ SOLN
INTRAMUSCULAR | Status: DC | PRN
  Administered 2024-03-19: 100 ug via INTRAVENOUS

## 2024-03-19 MED ORDER — OXYCODONE HCL 5 MG PO TABS: 5.0000 mg | ORAL_TABLET | Freq: Once | ORAL | Status: DC | PRN

## 2024-03-19 MED ORDER — ACETAMINOPHEN 500 MG PO TABS
1000.0000 mg | ORAL_TABLET | Freq: Once | ORAL | Status: AC
  Administered 2024-03-19: 1000 mg via ORAL
  Filled 2024-03-19: qty 2

## 2024-03-19 MED ORDER — SUGAMMADEX SODIUM 200 MG/2ML IV SOLN
INTRAVENOUS | Status: DC | PRN
  Administered 2024-03-19: 154.2 mg via INTRAVENOUS

## 2024-03-19 MED ORDER — DEXAMETHASONE SOD PHOSPHATE PF 10 MG/ML IJ SOLN
INTRAMUSCULAR | Status: AC
  Filled 2024-03-19: qty 1

## 2024-03-19 MED ORDER — LIDOCAINE 2% (20 MG/ML) 5 ML SYRINGE
INTRAMUSCULAR | Status: AC
  Filled 2024-03-19: qty 5

## 2024-03-19 MED ORDER — OXYCODONE HCL 5 MG PO TABS
5.0000 mg | ORAL_TABLET | ORAL | Status: DC | PRN
  Administered 2024-03-19 – 2024-03-20 (×4): 10 mg via ORAL
  Filled 2024-03-19 (×4): qty 2

## 2024-03-19 MED ORDER — BUPIVACAINE-EPINEPHRINE (PF) 0.25% -1:200000 IJ SOLN
INTRAMUSCULAR | Status: DC | PRN
  Administered 2024-03-19: 10 mL
  Administered 2024-03-19: 20 mL

## 2024-03-19 MED ORDER — ROCURONIUM BROMIDE 10 MG/ML (PF) SYRINGE
PREFILLED_SYRINGE | INTRAVENOUS | Status: DC | PRN
  Administered 2024-03-19: 70 mg via INTRAVENOUS

## 2024-03-19 MED ORDER — EPHEDRINE 5 MG/ML INJ
INTRAVENOUS | Status: AC
  Filled 2024-03-19: qty 5

## 2024-03-19 MED ORDER — POLYETHYLENE GLYCOL 3350 17 G PO PACK: 17.0000 g | PACK | Freq: Every day | ORAL | Status: DC | PRN

## 2024-03-19 MED ORDER — CEFAZOLIN SODIUM-DEXTROSE 2-4 GM/100ML-% IV SOLN
2.0000 g | INTRAVENOUS | Status: AC
  Administered 2024-03-19: 2 g via INTRAVENOUS
  Filled 2024-03-19: qty 100

## 2024-03-19 MED ORDER — TECHNETIUM TC 99M TILMANOCEPT KIT
0.5000 | PACK | Freq: Once | INTRAVENOUS | Status: AC | PRN
  Administered 2024-03-19: 0.5 via INTRADERMAL

## 2024-03-19 NOTE — Transfer of Care (Signed)
 Immediate Anesthesia Transfer of Care Note  Patient: Kathryn Burgess  Procedure(s) Performed: WIDE LOCAL EXCISION OF RIGHT LEG MELANOMA  RIGHT INGUINAL SENTINEL LYMPH NODE BIOPSY (Right)  Patient Location: PACU  Anesthesia Type:General  Level of Consciousness: drowsy and patient cooperative  Airway & Oxygen Therapy: Patient Spontanous Breathing and Patient connected to face mask oxygen  Post-op Assessment: Report given to RN and Post -op Vital signs reviewed and stable  Post vital signs: stable  Last Vitals:  Vitals Value Taken Time  BP 118/74 03/19/24 12:36  Temp    Pulse 70 03/19/24 12:41  Resp 23 03/19/24 12:41  SpO2 93 % 03/19/24 12:41  Vitals shown include unfiled device data.  Last Pain:  Vitals:   03/19/24 0948  PainSc: 3       Patients Stated Pain Goal: 1 (03/19/24 0936)  Complications: No notable events documented.

## 2024-03-19 NOTE — Op Note (Signed)
 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 Postoperative Diagnosis: Same  Procedure:  Right inguinal sentinel lymph node biopsy Wide local excision of right lower leg melanoma   Surgeon: Leonor Dawn, MD  EBL: 30 mL  Anesthesia: General endotracheal  Specimens:  Right inguinal sentinel lymph node #1 Right inguinal sentinel lymph node #2 Right inguinal sentinel lymph node #3 Right inguinal sentinel lymph node #4 Right lower leg melanoma (short stitch marks proximal margin, long stitch marks lateral margin)  Indications: Kathryn Burgess is a 54 yo female   Findings: Four right inguinal sentinel lymph nodes, which were hot and also contained isosulfan blue . Wide local excision of the primary lesion with 1.5cm circumferential margins was performed (total excised dimensions of 9cm x 5cm).  Procedure details: Informed consent was obtained in the preoperative area prior to the procedure. The patient was brought to the operating room and placed on the table in the supine position. General anesthesia was induced and appropriate lines and drains were placed for intraoperative monitoring. Perioperative antibiotics were administered per SCIP guidelines. The dermis at the site of the primary lesion on the right leg was injected with 2mL isosulfan blue . The right groin was prepped and draped in the usual sterile fashion. A pre-procedure timeout was taken verifying patient identity, surgical site and procedure to be performed.  The neoprobe was used to probe the right groin and the site with a highest count was notified just inferior to the inguinal crease.  A vertical skin incision was made and the subcutaneous tissue was divided with cautery.  Scarpa's layer was opened with cautery.  The neoprobe was used to identify a hot node, which also contained blue dye.  The node was grasped and elevated into the wound, and dissected  out using blunt dissection and cautery.  Small vessels were clipped prior to division.  The node was excised and an ex vivo count was obtained at 260.  The node was sent for routine pathology as sentinel lymph node #1.  There was still significant activity within the wound.  Another node was identified which was hot and contained blue dye.  This was circumferentially dissected out, and the lymphatic vessels were clipped prior to division.  The ex vivo count of this node was 900.  The node was sent to pathology as sentinel lymph node #2.  There was still significant activity within the wound greater than 100.  Another hot node was identified and elevated to the wound.  The node was dissected out and the lymphatic vessels were clipped.  This node contained faint blue dye as well.  The ex vivo count was considerably lower than the first 2 nodes.  The specimen was sent for routine pathology as sentinel node #3.  There was still a count of greater than 100 within the wound.  A final sentinel lymph node which contained faint traces of blue dye was identified and dissected out.  The lymphatic vessels were clipped prior to division with cautery.  The node was sent for routine pathology as sentinel lymph node #4.  The wound was probed with the neoprobe, and the count was less than 10% of the highest ex vivo count of the sentinel nodes.  The wound was irrigated and hemostasis was achieved with cautery.  Scarpa's layer was closed with interrupted 3-0 Vicryl sutures.  The skin was closed with running subcuticular 4 Monocryl suture.  Dermabond was applied.  The  patient was then placed in the prone position and the right lower leg was prepped and draped in the usual sterile fashion.  1.5 cm circumferential margins were measured and marked around the previous biopsy site.  An elliptical skin incision along the long axis of the extremity was then marked out to include the margins.  A skin incision was sharply made and the  subcutaneous tissue was divided with cautery down to the fascia. A small saphenous vein branch was ligated with 3-0 Vicryl ties and divided. The specimen was taken off the fascia using cautery.  The specimen was oriented and sent for routine pathology. Hemostasis was achieved in the wound using cautery. There was significant tension on the wound when attempting to bring the wound edges together for primary closure. Small subcutaneous skin flaps were raised circumferentially using cautery, but the wound was still not able to close primarily. The superior and inferior portions of the wound were able to be closed using 3-0 Vicryl deep dermal sutures. The remainder was not able to be reapproximated. The wound was left open and a Prevena negative pressure dressing was applied.  The patient tolerated the procedure well with no apparent complications. All counts were correct x2 at the end of the procedure. The patient was extubated and taken to PACU in stable condition.   Leonor Dawn, MD 03/19/24 12:53 PM

## 2024-03-19 NOTE — Anesthesia Postprocedure Evaluation (Signed)
"   Anesthesia Post Note  Patient: Kathryn Burgess  Procedure(s) Performed: WIDE LOCAL EXCISION OF RIGHT LEG MELANOMA  RIGHT INGUINAL SENTINEL LYMPH NODE BIOPSY (Right)     Patient location during evaluation: PACU Anesthesia Type: General Level of consciousness: awake Pain management: pain level controlled Vital Signs Assessment: post-procedure vital signs reviewed and stable Respiratory status: spontaneous breathing, nonlabored ventilation and respiratory function stable Cardiovascular status: blood pressure returned to baseline and stable Postop Assessment: no apparent nausea or vomiting Anesthetic complications: no   No notable events documented.  Last Vitals:  Vitals:   03/19/24 1330 03/19/24 1345  BP: 126/76 113/70  Pulse: (!) 56 (!) 56  Resp: 17 16  Temp:    SpO2: 96% 93%    Last Pain:  Vitals:   03/19/24 1325  PainSc: 6                  Delon Aisha Arch      "

## 2024-03-19 NOTE — Anesthesia Procedure Notes (Signed)
 Procedure Name: Intubation Date/Time: 03/19/2024 11:02 AM  Performed by: Elly Pfeiffer, CRNAPre-anesthesia Checklist: Patient identified, Emergency Drugs available, Suction available and Patient being monitored Patient Re-evaluated:Patient Re-evaluated prior to induction Oxygen Delivery Method: Circle system utilized Preoxygenation: Pre-oxygenation with 100% oxygen Induction Type: IV induction Ventilation: Mask ventilation without difficulty and Oral airway inserted - appropriate to patient size Laryngoscope Size: Glidescope and 3 Grade View: Grade II Tube type: Oral Number of attempts: 2 Airway Equipment and Method: Stylet and Oral airway Placement Confirmation: ETT inserted through vocal cords under direct vision, positive ETCO2 and breath sounds checked- equal and bilateral Secured at: 22 cm Tube secured with: Tape Dental Injury: Teeth and Oropharynx as per pre-operative assessment  Comments: Cords clear; no trauma; limited mouth opening with retrognathia noted; DL with MAC 4 yielded limited view of glottic opening; use glidescope in future.CA

## 2024-03-19 NOTE — Interval H&P Note (Signed)
 History and Physical Interval Note:  03/19/2024 10:26 AM  Kathryn Burgess  has presented today for surgery, with the diagnosis of MELANOMA OF RIGHT LEG.  The various methods of treatment have been discussed with the patient and family. After consideration of risks, benefits and other options for treatment, the patient has consented to  Procedures with comments: EXCISION, MELANOMA, WITH SENTINEL LYMPH NODE BIOPSY (Right) - WIDE LOCAL EXCISION OF RIGHT LEG MELANOMA  RIGHT INGUINAL SENTINEL LYMPH NODE BIOPSY as a surgical intervention.  The patient's history has been reviewed, patient examined, no change in status, stable for surgery. Surgical site confirmed and marked.  NM injection completed in preop. I have reviewed the patient's chart and labs.  Questions were answered to the patient's satisfaction.     Leonor LITTIE Dawn

## 2024-03-20 ENCOUNTER — Encounter (HOSPITAL_COMMUNITY): Payer: Self-pay | Admitting: Surgery

## 2024-03-20 MED ORDER — SACUBITRIL-VALSARTAN 49-51 MG PO TABS
1.0000 | ORAL_TABLET | Freq: Two times a day (BID) | ORAL | Status: DC
  Filled 2024-03-20: qty 1

## 2024-03-20 MED ORDER — VITAMIN D 25 MCG (1000 UNIT) PO TABS: 5000.0000 [IU] | ORAL_TABLET | Freq: Every day | ORAL | Status: DC

## 2024-03-20 MED ORDER — FAMOTIDINE 20 MG PO TABS
20.0000 mg | ORAL_TABLET | Freq: Every day | ORAL | Status: DC
  Administered 2024-03-20: 20 mg via ORAL
  Filled 2024-03-20: qty 1

## 2024-03-20 MED ORDER — POLYETHYLENE GLYCOL 3350 17 G PO PACK: 17.0000 g | PACK | Freq: Every day | ORAL | Status: AC | PRN

## 2024-03-20 MED ORDER — AMLODIPINE BESYLATE 5 MG PO TABS: 5.0000 mg | ORAL_TABLET | Freq: Every day | ORAL | Status: DC

## 2024-03-20 MED ORDER — BUPROPION HCL ER (XL) 150 MG PO TB24
150.0000 mg | ORAL_TABLET | Freq: Every day | ORAL | Status: DC
  Filled 2024-03-20: qty 1

## 2024-03-20 MED ORDER — ACETAMINOPHEN 500 MG PO TABS: 500.0000 mg | ORAL_TABLET | Freq: Four times a day (QID) | ORAL | Status: AC | PRN

## 2024-03-20 MED ORDER — OXYCODONE HCL 5 MG PO TABS: 5.0000 mg | ORAL_TABLET | Freq: Four times a day (QID) | ORAL | 0 refills | Status: AC | PRN

## 2024-03-20 MED ORDER — ASPIRIN 81 MG PO CHEW: 81.0000 mg | CHEWABLE_TABLET | Freq: Every day | ORAL | Status: DC

## 2024-03-20 MED ORDER — TRAZODONE HCL 50 MG PO TABS: 100.0000 mg | ORAL_TABLET | Freq: Every evening | ORAL | Status: DC | PRN

## 2024-03-20 MED ORDER — METHOCARBAMOL 500 MG PO TABS: 500.0000 mg | ORAL_TABLET | Freq: Three times a day (TID) | ORAL | 0 refills | Status: AC | PRN

## 2024-03-20 MED ORDER — ATORVASTATIN CALCIUM 40 MG PO TABS: 40.0000 mg | ORAL_TABLET | Freq: Every day | ORAL | Status: DC

## 2024-03-20 MED ORDER — EZETIMIBE 10 MG PO TABS: 10.0000 mg | ORAL_TABLET | Freq: Every day | ORAL | Status: DC

## 2024-03-20 NOTE — Plan of Care (Signed)

## 2024-03-20 NOTE — Progress Notes (Signed)
 Patient feels well this morning. Pain controlled. Has been ambulating in room without difficulty. Feels ready to go home today. Prevena remains in place on RLE with good seal. Vitals stable. Patient instructed on wet-to-dry dressing placement if Prevena stops working prior to her follow up appointment, and was provided with dressing supplies. She is scheduled on Tuesday for a wound check.

## 2024-03-27 LAB — SURGICAL PATHOLOGY
